# Patient Record
Sex: Female | Born: 1995
Health system: Southern US, Community
[De-identification: ages and names within clinical notes are randomized; demographics above are authoritative.]

## PROBLEM LIST (undated history)

## (undated) DIAGNOSIS — K219 Gastro-esophageal reflux disease without esophagitis: Secondary | ICD-10-CM

## (undated) DIAGNOSIS — N390 Urinary tract infection, site not specified: Secondary | ICD-10-CM

## (undated) DIAGNOSIS — R519 Headache, unspecified: Secondary | ICD-10-CM

## (undated) DIAGNOSIS — B379 Candidiasis, unspecified: Secondary | ICD-10-CM

## (undated) DIAGNOSIS — K8020 Calculus of gallbladder without cholecystitis without obstruction: Secondary | ICD-10-CM

## (undated) DIAGNOSIS — R51 Headache: Secondary | ICD-10-CM

## (undated) HISTORY — PX: WISDOM TOOTH EXTRACTION: SHX21

---
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
## 2017-05-18 IMAGING — RF DG CHOLANGIOGRAM OPERATIVE
1 series · 8 of 8 positions shown · non-contrast
Comparison: Ultrasound 09/30/2015

CLINICAL DATA: Intra op IOC. Hx of gallstones. eval for CBD stones.
Imaging done in [facility] OR Rm 3.

EXAM:
INTRAOPERATIVE CHOLANGIOGRAM
TECHNIQUE: Cholangiographic images from the C-arm fluoroscopic device were
submitted for interpretation post-operatively. Please see the
procedural report for the amount of contrast and the fluoroscopy
time utilized.

[Series 1: run · 2 acquisitions, 8 frames shown]
[im 1/2]
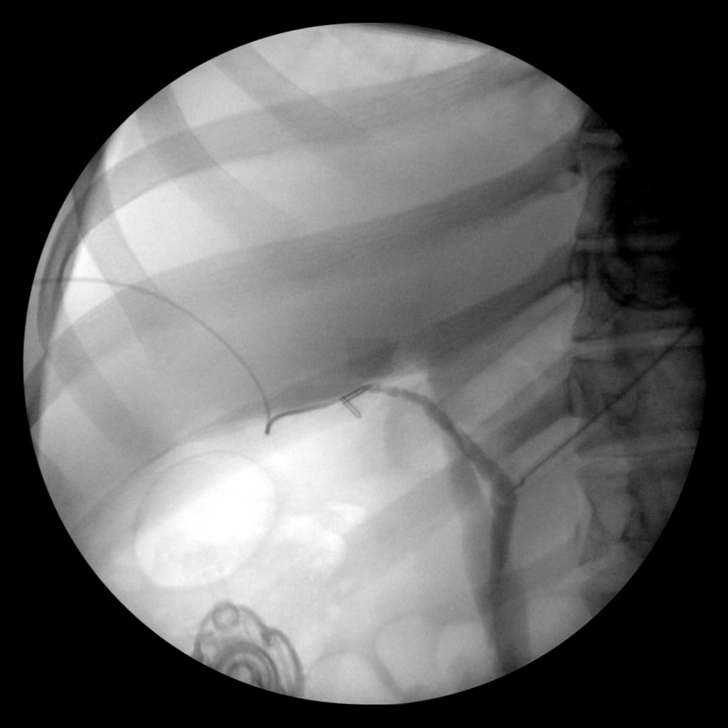
[im 1/2]
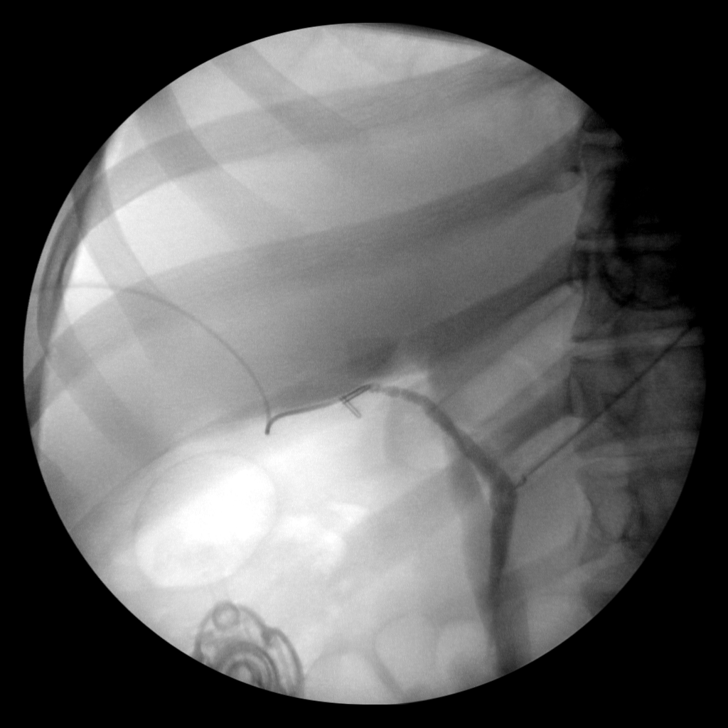
[im 1/2]
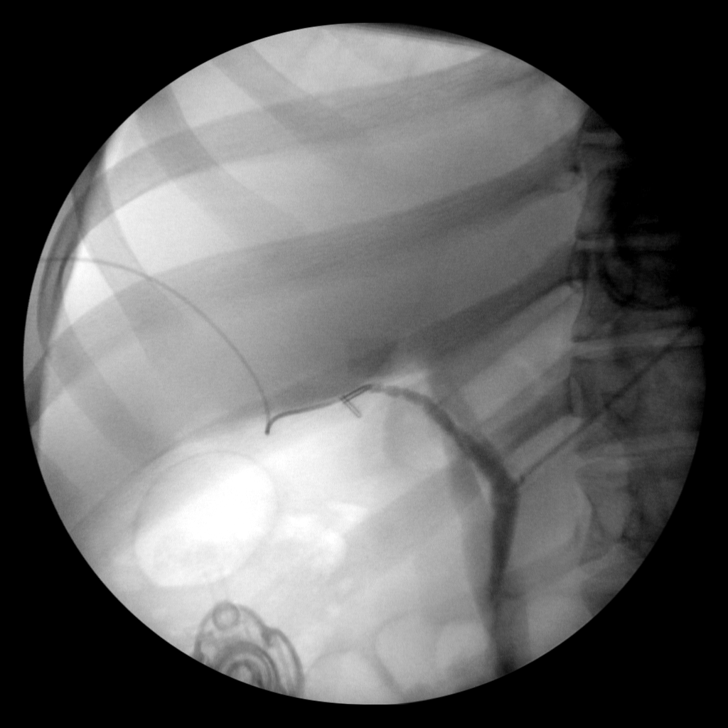
[im 1/2]
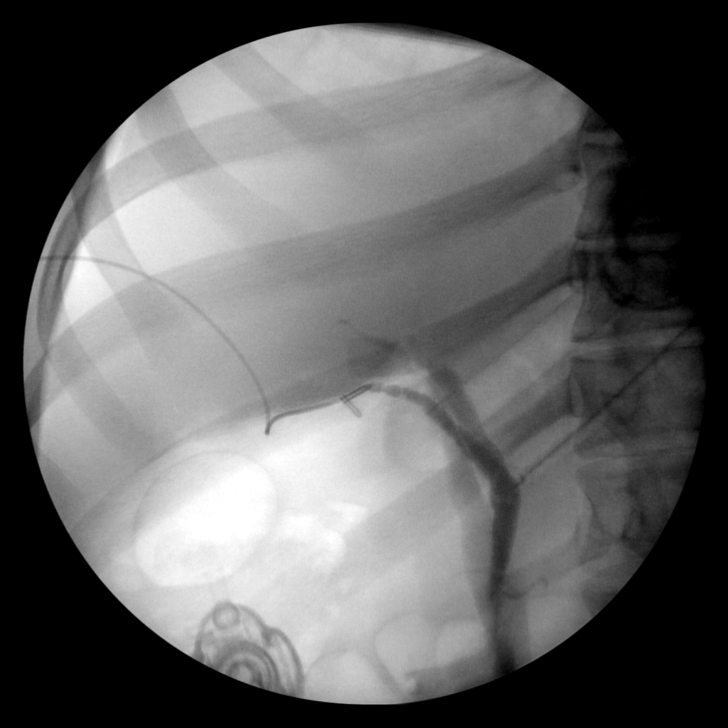
[im 2/2]
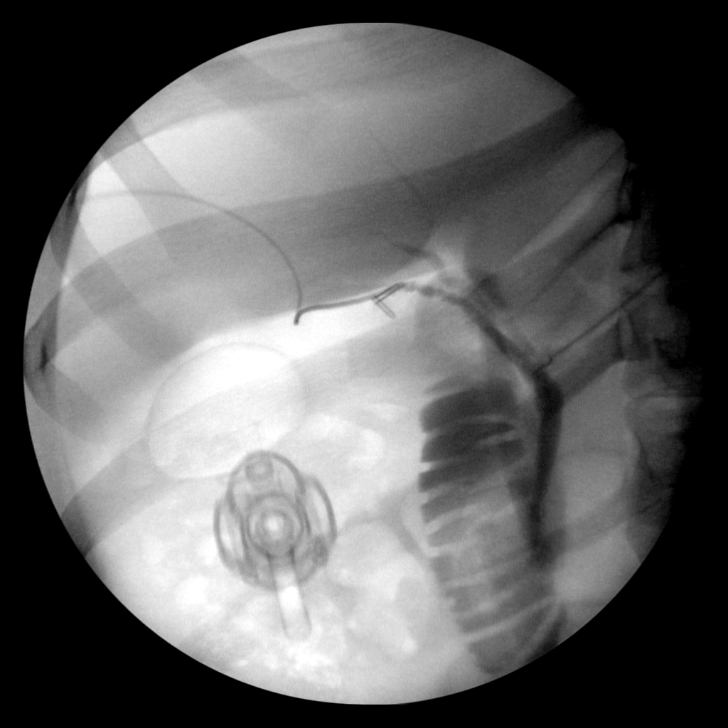
[im 2/2]
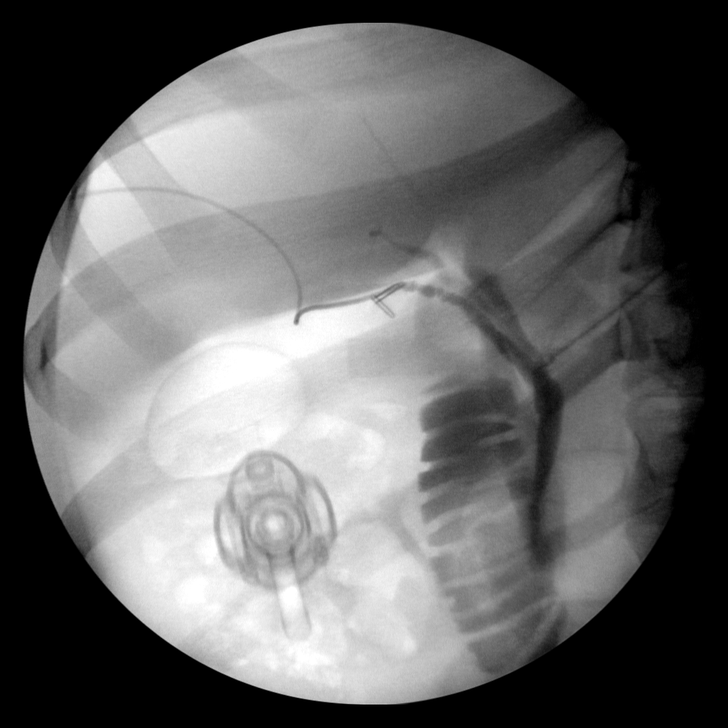
[im 2/2]
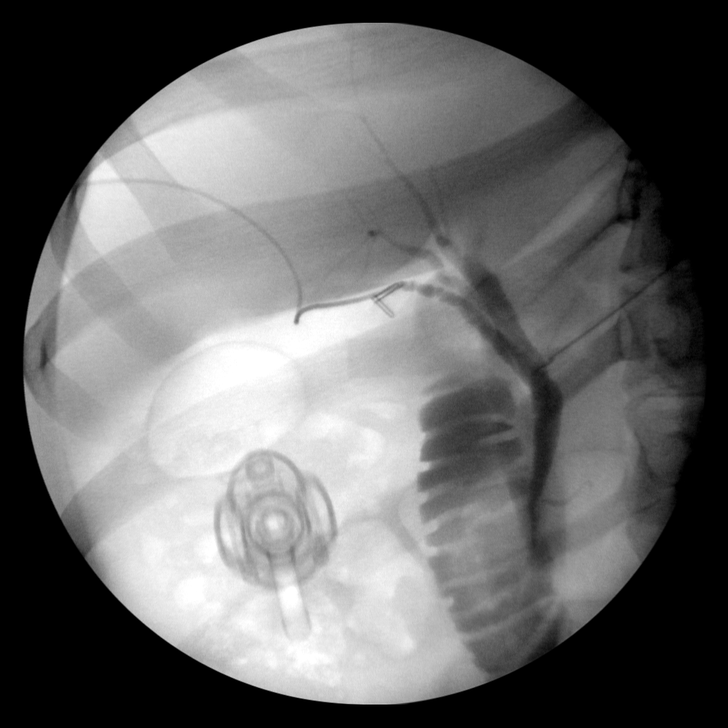
[im 2/2]
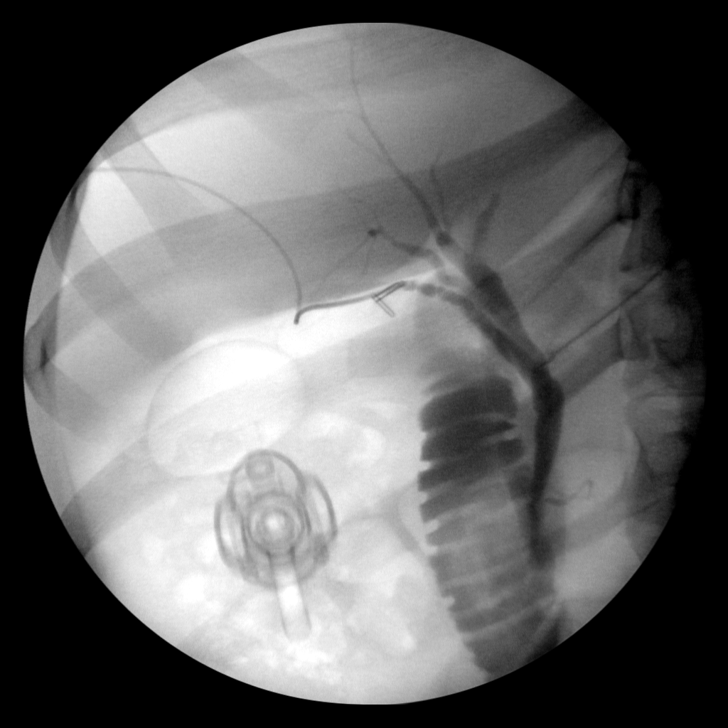

[8 of 8 positions shown; findings below may reference images not displayed]

FINDINGS: No persistent filling defects in the common duct. Intrahepatic ducts
are incompletely visualized, appearing decompressed centrally.
Contrast passes into the duodenum.

:
Negative for retained common duct stone.
